# Patient Record
Sex: Female | Born: 1995 | Race: White | Hispanic: No | Marital: Single | Smoking: Current every day smoker
Health system: Southern US, Community
[De-identification: ages and names within clinical notes are randomized; demographics above are authoritative.]

## PROBLEM LIST (undated history)

## (undated) DIAGNOSIS — F32A Depression, unspecified: Secondary | ICD-10-CM

## (undated) DIAGNOSIS — F329 Major depressive disorder, single episode, unspecified: Secondary | ICD-10-CM

---
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
